# Patient Record
Sex: Male | Born: 1980 | Race: Black or African American | Hispanic: No | Marital: Married | Smoking: Never smoker
Health system: Southern US, Community
[De-identification: ages and names within clinical notes are randomized; demographics above are authoritative.]

## PROBLEM LIST (undated history)

## (undated) DIAGNOSIS — G4700 Insomnia, unspecified: Secondary | ICD-10-CM

## (undated) HISTORY — PX: UPPER GI ENDOSCOPY: SHX6162

## (undated) HISTORY — PX: OTHER SURGICAL HISTORY: SHX169

---

## 2016-10-07 DIAGNOSIS — L723 Sebaceous cyst: Secondary | ICD-10-CM | POA: Diagnosis not present

## 2016-11-04 DIAGNOSIS — Z0000 Encounter for general adult medical examination without abnormal findings: Secondary | ICD-10-CM | POA: Diagnosis not present

## 2016-11-04 DIAGNOSIS — Z13220 Encounter for screening for lipoid disorders: Secondary | ICD-10-CM | POA: Diagnosis not present

## 2018-06-16 DIAGNOSIS — L03011 Cellulitis of right finger: Secondary | ICD-10-CM | POA: Diagnosis not present

## 2018-06-16 DIAGNOSIS — K5909 Other constipation: Secondary | ICD-10-CM | POA: Diagnosis not present

## 2018-06-16 DIAGNOSIS — R030 Elevated blood-pressure reading, without diagnosis of hypertension: Secondary | ICD-10-CM | POA: Diagnosis not present

## 2018-06-21 DIAGNOSIS — L02511 Cutaneous abscess of right hand: Secondary | ICD-10-CM | POA: Diagnosis not present

## 2018-06-21 DIAGNOSIS — L03011 Cellulitis of right finger: Secondary | ICD-10-CM | POA: Diagnosis not present

## 2018-06-23 DIAGNOSIS — L089 Local infection of the skin and subcutaneous tissue, unspecified: Secondary | ICD-10-CM | POA: Diagnosis not present

## 2018-11-15 DIAGNOSIS — Z0000 Encounter for general adult medical examination without abnormal findings: Secondary | ICD-10-CM | POA: Diagnosis not present

## 2019-04-28 DIAGNOSIS — M545 Low back pain: Secondary | ICD-10-CM | POA: Diagnosis not present

## 2019-07-12 DIAGNOSIS — M545 Low back pain: Secondary | ICD-10-CM | POA: Diagnosis not present

## 2019-07-12 DIAGNOSIS — B351 Tinea unguium: Secondary | ICD-10-CM | POA: Diagnosis not present

## 2019-09-14 DIAGNOSIS — F411 Generalized anxiety disorder: Secondary | ICD-10-CM | POA: Diagnosis not present

## 2019-09-21 DIAGNOSIS — F411 Generalized anxiety disorder: Secondary | ICD-10-CM | POA: Diagnosis not present

## 2019-09-22 DIAGNOSIS — Z23 Encounter for immunization: Secondary | ICD-10-CM | POA: Diagnosis not present

## 2019-09-22 DIAGNOSIS — B351 Tinea unguium: Secondary | ICD-10-CM | POA: Diagnosis not present

## 2019-09-22 DIAGNOSIS — K5909 Other constipation: Secondary | ICD-10-CM | POA: Diagnosis not present

## 2019-09-22 DIAGNOSIS — Z79899 Other long term (current) drug therapy: Secondary | ICD-10-CM | POA: Diagnosis not present

## 2019-09-28 DIAGNOSIS — F411 Generalized anxiety disorder: Secondary | ICD-10-CM | POA: Diagnosis not present

## 2019-10-05 DIAGNOSIS — F411 Generalized anxiety disorder: Secondary | ICD-10-CM | POA: Diagnosis not present

## 2019-10-13 DIAGNOSIS — F411 Generalized anxiety disorder: Secondary | ICD-10-CM | POA: Diagnosis not present

## 2019-10-19 DIAGNOSIS — F411 Generalized anxiety disorder: Secondary | ICD-10-CM | POA: Diagnosis not present

## 2019-10-25 DIAGNOSIS — F419 Anxiety disorder, unspecified: Secondary | ICD-10-CM | POA: Diagnosis not present

## 2019-10-25 DIAGNOSIS — R41840 Attention and concentration deficit: Secondary | ICD-10-CM | POA: Diagnosis not present

## 2019-10-25 DIAGNOSIS — F902 Attention-deficit hyperactivity disorder, combined type: Secondary | ICD-10-CM | POA: Diagnosis not present

## 2019-10-26 DIAGNOSIS — F900 Attention-deficit hyperactivity disorder, predominantly inattentive type: Secondary | ICD-10-CM | POA: Diagnosis not present

## 2019-10-26 DIAGNOSIS — F411 Generalized anxiety disorder: Secondary | ICD-10-CM | POA: Diagnosis not present

## 2019-11-01 DIAGNOSIS — B351 Tinea unguium: Secondary | ICD-10-CM | POA: Diagnosis not present

## 2019-11-01 DIAGNOSIS — Z79899 Other long term (current) drug therapy: Secondary | ICD-10-CM | POA: Diagnosis not present

## 2019-11-02 DIAGNOSIS — F330 Major depressive disorder, recurrent, mild: Secondary | ICD-10-CM | POA: Diagnosis not present

## 2019-11-02 DIAGNOSIS — F411 Generalized anxiety disorder: Secondary | ICD-10-CM | POA: Diagnosis not present

## 2019-11-27 DIAGNOSIS — F411 Generalized anxiety disorder: Secondary | ICD-10-CM | POA: Diagnosis not present

## 2019-11-29 DIAGNOSIS — F411 Generalized anxiety disorder: Secondary | ICD-10-CM | POA: Diagnosis not present

## 2019-12-05 DIAGNOSIS — R41840 Attention and concentration deficit: Secondary | ICD-10-CM | POA: Diagnosis not present

## 2019-12-05 DIAGNOSIS — F419 Anxiety disorder, unspecified: Secondary | ICD-10-CM | POA: Diagnosis not present

## 2019-12-05 DIAGNOSIS — F902 Attention-deficit hyperactivity disorder, combined type: Secondary | ICD-10-CM | POA: Diagnosis not present

## 2019-12-05 DIAGNOSIS — Z79899 Other long term (current) drug therapy: Secondary | ICD-10-CM | POA: Diagnosis not present

## 2019-12-13 DIAGNOSIS — F411 Generalized anxiety disorder: Secondary | ICD-10-CM | POA: Diagnosis not present

## 2019-12-21 DIAGNOSIS — F900 Attention-deficit hyperactivity disorder, predominantly inattentive type: Secondary | ICD-10-CM | POA: Diagnosis not present

## 2019-12-21 DIAGNOSIS — F411 Generalized anxiety disorder: Secondary | ICD-10-CM | POA: Diagnosis not present

## 2020-01-03 DIAGNOSIS — F900 Attention-deficit hyperactivity disorder, predominantly inattentive type: Secondary | ICD-10-CM | POA: Diagnosis not present

## 2020-01-17 DIAGNOSIS — F900 Attention-deficit hyperactivity disorder, predominantly inattentive type: Secondary | ICD-10-CM | POA: Diagnosis not present

## 2020-01-25 DIAGNOSIS — F900 Attention-deficit hyperactivity disorder, predominantly inattentive type: Secondary | ICD-10-CM | POA: Diagnosis not present

## 2020-01-25 DIAGNOSIS — Z79899 Other long term (current) drug therapy: Secondary | ICD-10-CM | POA: Diagnosis not present

## 2020-01-25 DIAGNOSIS — B351 Tinea unguium: Secondary | ICD-10-CM | POA: Diagnosis not present

## 2020-01-25 DIAGNOSIS — H6123 Impacted cerumen, bilateral: Secondary | ICD-10-CM | POA: Diagnosis not present

## 2020-01-31 DIAGNOSIS — F900 Attention-deficit hyperactivity disorder, predominantly inattentive type: Secondary | ICD-10-CM | POA: Diagnosis not present

## 2020-02-14 DIAGNOSIS — F900 Attention-deficit hyperactivity disorder, predominantly inattentive type: Secondary | ICD-10-CM | POA: Diagnosis not present

## 2020-03-06 DIAGNOSIS — Z79899 Other long term (current) drug therapy: Secondary | ICD-10-CM | POA: Diagnosis not present

## 2020-03-06 DIAGNOSIS — F902 Attention-deficit hyperactivity disorder, combined type: Secondary | ICD-10-CM | POA: Diagnosis not present

## 2020-03-06 DIAGNOSIS — F419 Anxiety disorder, unspecified: Secondary | ICD-10-CM | POA: Diagnosis not present

## 2020-03-08 DIAGNOSIS — F900 Attention-deficit hyperactivity disorder, predominantly inattentive type: Secondary | ICD-10-CM | POA: Diagnosis not present

## 2020-03-22 DIAGNOSIS — F900 Attention-deficit hyperactivity disorder, predominantly inattentive type: Secondary | ICD-10-CM | POA: Diagnosis not present

## 2020-04-05 DIAGNOSIS — F900 Attention-deficit hyperactivity disorder, predominantly inattentive type: Secondary | ICD-10-CM | POA: Diagnosis not present

## 2020-04-19 DIAGNOSIS — F900 Attention-deficit hyperactivity disorder, predominantly inattentive type: Secondary | ICD-10-CM | POA: Diagnosis not present

## 2020-05-03 DIAGNOSIS — F900 Attention-deficit hyperactivity disorder, predominantly inattentive type: Secondary | ICD-10-CM | POA: Diagnosis not present

## 2020-05-13 DIAGNOSIS — F900 Attention-deficit hyperactivity disorder, predominantly inattentive type: Secondary | ICD-10-CM | POA: Diagnosis not present

## 2020-06-11 DIAGNOSIS — Z79899 Other long term (current) drug therapy: Secondary | ICD-10-CM | POA: Diagnosis not present

## 2020-06-11 DIAGNOSIS — F902 Attention-deficit hyperactivity disorder, combined type: Secondary | ICD-10-CM | POA: Diagnosis not present

## 2020-06-14 DIAGNOSIS — F900 Attention-deficit hyperactivity disorder, predominantly inattentive type: Secondary | ICD-10-CM | POA: Diagnosis not present

## 2020-06-26 DIAGNOSIS — F900 Attention-deficit hyperactivity disorder, predominantly inattentive type: Secondary | ICD-10-CM | POA: Diagnosis not present

## 2020-07-03 DIAGNOSIS — F900 Attention-deficit hyperactivity disorder, predominantly inattentive type: Secondary | ICD-10-CM | POA: Diagnosis not present

## 2020-07-12 DIAGNOSIS — F900 Attention-deficit hyperactivity disorder, predominantly inattentive type: Secondary | ICD-10-CM | POA: Diagnosis not present

## 2020-07-18 DIAGNOSIS — Z0000 Encounter for general adult medical examination without abnormal findings: Secondary | ICD-10-CM | POA: Diagnosis not present

## 2020-07-18 DIAGNOSIS — Z13220 Encounter for screening for lipoid disorders: Secondary | ICD-10-CM | POA: Diagnosis not present

## 2020-07-18 DIAGNOSIS — K5900 Constipation, unspecified: Secondary | ICD-10-CM | POA: Diagnosis not present

## 2020-07-18 DIAGNOSIS — L723 Sebaceous cyst: Secondary | ICD-10-CM | POA: Diagnosis not present

## 2020-07-26 DIAGNOSIS — F900 Attention-deficit hyperactivity disorder, predominantly inattentive type: Secondary | ICD-10-CM | POA: Diagnosis not present

## 2020-07-31 DIAGNOSIS — F900 Attention-deficit hyperactivity disorder, predominantly inattentive type: Secondary | ICD-10-CM | POA: Diagnosis not present

## 2020-08-07 DIAGNOSIS — F900 Attention-deficit hyperactivity disorder, predominantly inattentive type: Secondary | ICD-10-CM | POA: Diagnosis not present

## 2020-08-29 DIAGNOSIS — F900 Attention-deficit hyperactivity disorder, predominantly inattentive type: Secondary | ICD-10-CM | POA: Diagnosis not present

## 2020-09-10 DIAGNOSIS — Z79899 Other long term (current) drug therapy: Secondary | ICD-10-CM | POA: Diagnosis not present

## 2020-09-10 DIAGNOSIS — F902 Attention-deficit hyperactivity disorder, combined type: Secondary | ICD-10-CM | POA: Diagnosis not present

## 2020-09-10 DIAGNOSIS — F988 Other specified behavioral and emotional disorders with onset usually occurring in childhood and adolescence: Secondary | ICD-10-CM | POA: Diagnosis not present

## 2020-09-12 DIAGNOSIS — M25531 Pain in right wrist: Secondary | ICD-10-CM | POA: Diagnosis not present

## 2020-09-12 DIAGNOSIS — S76211A Strain of adductor muscle, fascia and tendon of right thigh, initial encounter: Secondary | ICD-10-CM | POA: Diagnosis not present

## 2020-09-13 DIAGNOSIS — F4329 Adjustment disorder with other symptoms: Secondary | ICD-10-CM | POA: Diagnosis not present

## 2020-09-13 DIAGNOSIS — F900 Attention-deficit hyperactivity disorder, predominantly inattentive type: Secondary | ICD-10-CM | POA: Diagnosis not present

## 2020-09-19 DIAGNOSIS — F900 Attention-deficit hyperactivity disorder, predominantly inattentive type: Secondary | ICD-10-CM | POA: Diagnosis not present

## 2020-09-30 DIAGNOSIS — F4329 Adjustment disorder with other symptoms: Secondary | ICD-10-CM | POA: Diagnosis not present

## 2020-09-30 DIAGNOSIS — F900 Attention-deficit hyperactivity disorder, predominantly inattentive type: Secondary | ICD-10-CM | POA: Diagnosis not present

## 2020-10-09 ENCOUNTER — Institutional Professional Consult (permissible substitution): Payer: Self-pay | Admitting: Plastic Surgery

## 2020-10-10 DIAGNOSIS — F900 Attention-deficit hyperactivity disorder, predominantly inattentive type: Secondary | ICD-10-CM | POA: Diagnosis not present

## 2020-10-24 DIAGNOSIS — F4329 Adjustment disorder with other symptoms: Secondary | ICD-10-CM | POA: Diagnosis not present

## 2020-10-24 DIAGNOSIS — F900 Attention-deficit hyperactivity disorder, predominantly inattentive type: Secondary | ICD-10-CM | POA: Diagnosis not present

## 2020-11-07 DIAGNOSIS — F4329 Adjustment disorder with other symptoms: Secondary | ICD-10-CM | POA: Diagnosis not present

## 2020-11-07 DIAGNOSIS — F900 Attention-deficit hyperactivity disorder, predominantly inattentive type: Secondary | ICD-10-CM | POA: Diagnosis not present

## 2020-11-14 DIAGNOSIS — F4329 Adjustment disorder with other symptoms: Secondary | ICD-10-CM | POA: Diagnosis not present

## 2020-11-14 DIAGNOSIS — F900 Attention-deficit hyperactivity disorder, predominantly inattentive type: Secondary | ICD-10-CM | POA: Diagnosis not present

## 2020-11-19 DIAGNOSIS — F419 Anxiety disorder, unspecified: Secondary | ICD-10-CM | POA: Diagnosis not present

## 2020-11-21 DIAGNOSIS — F900 Attention-deficit hyperactivity disorder, predominantly inattentive type: Secondary | ICD-10-CM | POA: Diagnosis not present

## 2020-11-21 DIAGNOSIS — F4329 Adjustment disorder with other symptoms: Secondary | ICD-10-CM | POA: Diagnosis not present

## 2020-11-28 DIAGNOSIS — F4329 Adjustment disorder with other symptoms: Secondary | ICD-10-CM | POA: Diagnosis not present

## 2020-11-28 DIAGNOSIS — F900 Attention-deficit hyperactivity disorder, predominantly inattentive type: Secondary | ICD-10-CM | POA: Diagnosis not present

## 2020-12-12 DIAGNOSIS — F4329 Adjustment disorder with other symptoms: Secondary | ICD-10-CM | POA: Diagnosis not present

## 2020-12-12 DIAGNOSIS — F900 Attention-deficit hyperactivity disorder, predominantly inattentive type: Secondary | ICD-10-CM | POA: Diagnosis not present

## 2020-12-19 DIAGNOSIS — F4329 Adjustment disorder with other symptoms: Secondary | ICD-10-CM | POA: Diagnosis not present

## 2020-12-19 DIAGNOSIS — F900 Attention-deficit hyperactivity disorder, predominantly inattentive type: Secondary | ICD-10-CM | POA: Diagnosis not present

## 2020-12-24 DIAGNOSIS — F419 Anxiety disorder, unspecified: Secondary | ICD-10-CM | POA: Diagnosis not present

## 2020-12-26 DIAGNOSIS — F4329 Adjustment disorder with other symptoms: Secondary | ICD-10-CM | POA: Diagnosis not present

## 2020-12-26 DIAGNOSIS — F900 Attention-deficit hyperactivity disorder, predominantly inattentive type: Secondary | ICD-10-CM | POA: Diagnosis not present

## 2020-12-31 DIAGNOSIS — F419 Anxiety disorder, unspecified: Secondary | ICD-10-CM | POA: Diagnosis not present

## 2021-01-14 DIAGNOSIS — F4329 Adjustment disorder with other symptoms: Secondary | ICD-10-CM | POA: Diagnosis not present

## 2021-01-14 DIAGNOSIS — F900 Attention-deficit hyperactivity disorder, predominantly inattentive type: Secondary | ICD-10-CM | POA: Diagnosis not present

## 2021-01-21 DIAGNOSIS — F419 Anxiety disorder, unspecified: Secondary | ICD-10-CM | POA: Diagnosis not present

## 2021-01-24 DIAGNOSIS — F900 Attention-deficit hyperactivity disorder, predominantly inattentive type: Secondary | ICD-10-CM | POA: Diagnosis not present

## 2021-01-24 DIAGNOSIS — F4329 Adjustment disorder with other symptoms: Secondary | ICD-10-CM | POA: Diagnosis not present

## 2021-01-30 DIAGNOSIS — F4329 Adjustment disorder with other symptoms: Secondary | ICD-10-CM | POA: Diagnosis not present

## 2021-01-30 DIAGNOSIS — F900 Attention-deficit hyperactivity disorder, predominantly inattentive type: Secondary | ICD-10-CM | POA: Diagnosis not present

## 2021-02-04 DIAGNOSIS — F419 Anxiety disorder, unspecified: Secondary | ICD-10-CM | POA: Diagnosis not present

## 2021-02-12 ENCOUNTER — Other Ambulatory Visit: Payer: Self-pay

## 2021-02-12 ENCOUNTER — Encounter: Payer: Self-pay | Admitting: Plastic Surgery

## 2021-02-12 ENCOUNTER — Ambulatory Visit (INDEPENDENT_AMBULATORY_CARE_PROVIDER_SITE_OTHER): Payer: BC Managed Care – PPO | Admitting: Plastic Surgery

## 2021-02-12 VITALS — BP 135/82 | HR 81 | Ht 72.0 in | Wt 205.0 lb

## 2021-02-12 DIAGNOSIS — L723 Sebaceous cyst: Secondary | ICD-10-CM | POA: Diagnosis not present

## 2021-02-12 NOTE — Progress Notes (Signed)
° °  Referring Provider Aliene Beams, MD [id number] [place],  [place] [address]   CC:  Chief Complaint  Patient presents with   Consult      Jesse Jefferson is an 41 y.o. male.  HPI: Patient presents with concerns for several cystic lesions and a skin tag in his right upper cheek.  He had a mass excised in his central back a number of years ago.  He believes at some point over the past year or 2 its come back and is now palpable and mobile again.  He has another cystic lesion on his scalp and another on the back of his neck that are bothering him and all growing in size.  No Known Allergies  Outpatient Encounter Medications as of 02/12/2021  Medication Sig   VYVANSE 60 MG CHEW Chew by mouth. Take 30mg  by mouth bid.   No facility-administered encounter medications on file as of 02/12/2021.     No past medical history on file.  No family history on file.  Social History   Social History Narrative   Not on file     Review of Systems General: Denies fevers, chills, weight loss CV: Denies chest pain, shortness of breath, palpitations  Physical Exam Vitals with BMI 02/12/2021  Height 6\' 0"   Weight 205 lbs  BMI 27.8  Systolic 135  Diastolic 82  Pulse 81    General:  No acute distress,  Alert and oriented, Non-Toxic, Normal speech and affect Examination shows a small skin tag in the right upper cheek.  On his scalp there is a 3 cm subcutaneous mobile mass.  On the back of his neck there is a 1 cm subcutaneous mobile mass.  In his central back there is a 3 cm subcutaneous mobile mass.  In his back there is a longitudinal scar overlying it that smaller than the mass.  Assessment/Plan Patient presents with several lesions that I suspect to be cysts.  We discussed excision under local.  I could also excise the skin tag at the same time.  We discussed risks include bleeding, infection, damage to surrounding structures need for additional procedures.  All of his questions were  answered and he is interested in moving forward  04/12/2021 02/12/2021, 2:44 PM

## 2021-02-19 DIAGNOSIS — F4329 Adjustment disorder with other symptoms: Secondary | ICD-10-CM | POA: Diagnosis not present

## 2021-02-19 DIAGNOSIS — F900 Attention-deficit hyperactivity disorder, predominantly inattentive type: Secondary | ICD-10-CM | POA: Diagnosis not present

## 2021-02-20 ENCOUNTER — Ambulatory Visit: Payer: BC Managed Care – PPO | Admitting: Plastic Surgery

## 2021-03-04 DIAGNOSIS — F419 Anxiety disorder, unspecified: Secondary | ICD-10-CM | POA: Diagnosis not present

## 2021-03-06 ENCOUNTER — Ambulatory Visit: Payer: BC Managed Care – PPO | Admitting: Plastic Surgery

## 2021-03-10 DIAGNOSIS — F902 Attention-deficit hyperactivity disorder, combined type: Secondary | ICD-10-CM | POA: Diagnosis not present

## 2021-03-10 DIAGNOSIS — F4329 Adjustment disorder with other symptoms: Secondary | ICD-10-CM | POA: Diagnosis not present

## 2021-03-10 DIAGNOSIS — F900 Attention-deficit hyperactivity disorder, predominantly inattentive type: Secondary | ICD-10-CM | POA: Diagnosis not present

## 2021-03-10 DIAGNOSIS — Z79899 Other long term (current) drug therapy: Secondary | ICD-10-CM | POA: Diagnosis not present

## 2021-03-11 DIAGNOSIS — F419 Anxiety disorder, unspecified: Secondary | ICD-10-CM | POA: Diagnosis not present

## 2021-03-25 DIAGNOSIS — F419 Anxiety disorder, unspecified: Secondary | ICD-10-CM | POA: Diagnosis not present

## 2021-03-31 DIAGNOSIS — Z79899 Other long term (current) drug therapy: Secondary | ICD-10-CM | POA: Diagnosis not present

## 2021-03-31 DIAGNOSIS — B351 Tinea unguium: Secondary | ICD-10-CM | POA: Diagnosis not present

## 2021-04-02 DIAGNOSIS — F900 Attention-deficit hyperactivity disorder, predominantly inattentive type: Secondary | ICD-10-CM | POA: Diagnosis not present

## 2021-04-02 DIAGNOSIS — F4329 Adjustment disorder with other symptoms: Secondary | ICD-10-CM | POA: Diagnosis not present

## 2021-04-08 DIAGNOSIS — F419 Anxiety disorder, unspecified: Secondary | ICD-10-CM | POA: Diagnosis not present

## 2021-06-02 DIAGNOSIS — B351 Tinea unguium: Secondary | ICD-10-CM | POA: Diagnosis not present

## 2021-06-02 DIAGNOSIS — Z79899 Other long term (current) drug therapy: Secondary | ICD-10-CM | POA: Diagnosis not present

## 2021-06-19 DIAGNOSIS — F419 Anxiety disorder, unspecified: Secondary | ICD-10-CM | POA: Diagnosis not present

## 2021-06-20 DIAGNOSIS — F4329 Adjustment disorder with other symptoms: Secondary | ICD-10-CM | POA: Diagnosis not present

## 2021-06-20 DIAGNOSIS — F900 Attention-deficit hyperactivity disorder, predominantly inattentive type: Secondary | ICD-10-CM | POA: Diagnosis not present

## 2021-06-26 DIAGNOSIS — M545 Low back pain, unspecified: Secondary | ICD-10-CM | POA: Diagnosis not present

## 2021-06-30 DIAGNOSIS — M545 Low back pain, unspecified: Secondary | ICD-10-CM | POA: Diagnosis not present

## 2021-07-01 DIAGNOSIS — F4329 Adjustment disorder with other symptoms: Secondary | ICD-10-CM | POA: Diagnosis not present

## 2021-07-01 DIAGNOSIS — F900 Attention-deficit hyperactivity disorder, predominantly inattentive type: Secondary | ICD-10-CM | POA: Diagnosis not present

## 2021-07-03 ENCOUNTER — Other Ambulatory Visit: Payer: Self-pay | Admitting: Sports Medicine

## 2021-07-03 ENCOUNTER — Ambulatory Visit
Admission: RE | Admit: 2021-07-03 | Discharge: 2021-07-03 | Disposition: A | Discharge status: Home / Self Care | Payer: BC Managed Care – PPO | Source: Ambulatory Visit | Attending: Sports Medicine | Admitting: Sports Medicine

## 2021-07-03 DIAGNOSIS — M545 Low back pain, unspecified: Secondary | ICD-10-CM | POA: Diagnosis not present

## 2021-07-16 DIAGNOSIS — Z03818 Encounter for observation for suspected exposure to other biological agents ruled out: Secondary | ICD-10-CM | POA: Diagnosis not present

## 2021-07-16 DIAGNOSIS — R051 Acute cough: Secondary | ICD-10-CM | POA: Diagnosis not present

## 2021-07-16 DIAGNOSIS — J029 Acute pharyngitis, unspecified: Secondary | ICD-10-CM | POA: Diagnosis not present

## 2021-07-16 DIAGNOSIS — M791 Myalgia, unspecified site: Secondary | ICD-10-CM | POA: Diagnosis not present

## 2021-07-17 DIAGNOSIS — F4329 Adjustment disorder with other symptoms: Secondary | ICD-10-CM | POA: Diagnosis not present

## 2021-07-17 DIAGNOSIS — F900 Attention-deficit hyperactivity disorder, predominantly inattentive type: Secondary | ICD-10-CM | POA: Diagnosis not present

## 2021-07-21 DIAGNOSIS — F419 Anxiety disorder, unspecified: Secondary | ICD-10-CM | POA: Diagnosis not present

## 2021-07-31 DIAGNOSIS — F419 Anxiety disorder, unspecified: Secondary | ICD-10-CM | POA: Diagnosis not present

## 2021-07-31 DIAGNOSIS — F900 Attention-deficit hyperactivity disorder, predominantly inattentive type: Secondary | ICD-10-CM | POA: Diagnosis not present

## 2021-07-31 DIAGNOSIS — F4329 Adjustment disorder with other symptoms: Secondary | ICD-10-CM | POA: Diagnosis not present

## 2021-08-06 DIAGNOSIS — Z0000 Encounter for general adult medical examination without abnormal findings: Secondary | ICD-10-CM | POA: Diagnosis not present

## 2021-08-11 DIAGNOSIS — M5451 Vertebrogenic low back pain: Secondary | ICD-10-CM | POA: Diagnosis not present

## 2021-08-11 DIAGNOSIS — M6281 Muscle weakness (generalized): Secondary | ICD-10-CM | POA: Diagnosis not present

## 2021-08-15 DIAGNOSIS — F900 Attention-deficit hyperactivity disorder, predominantly inattentive type: Secondary | ICD-10-CM | POA: Diagnosis not present

## 2021-08-15 DIAGNOSIS — F4329 Adjustment disorder with other symptoms: Secondary | ICD-10-CM | POA: Diagnosis not present

## 2021-08-20 DIAGNOSIS — Z3009 Encounter for other general counseling and advice on contraception: Secondary | ICD-10-CM | POA: Diagnosis not present

## 2021-08-25 DIAGNOSIS — M6281 Muscle weakness (generalized): Secondary | ICD-10-CM | POA: Diagnosis not present

## 2021-08-25 DIAGNOSIS — M5451 Vertebrogenic low back pain: Secondary | ICD-10-CM | POA: Diagnosis not present

## 2021-08-28 DIAGNOSIS — F419 Anxiety disorder, unspecified: Secondary | ICD-10-CM | POA: Diagnosis not present

## 2021-09-04 DIAGNOSIS — Z79899 Other long term (current) drug therapy: Secondary | ICD-10-CM | POA: Diagnosis not present

## 2021-09-04 DIAGNOSIS — F902 Attention-deficit hyperactivity disorder, combined type: Secondary | ICD-10-CM | POA: Diagnosis not present

## 2021-09-18 DIAGNOSIS — F900 Attention-deficit hyperactivity disorder, predominantly inattentive type: Secondary | ICD-10-CM | POA: Diagnosis not present

## 2021-09-18 DIAGNOSIS — F4329 Adjustment disorder with other symptoms: Secondary | ICD-10-CM | POA: Diagnosis not present

## 2021-10-02 DIAGNOSIS — F900 Attention-deficit hyperactivity disorder, predominantly inattentive type: Secondary | ICD-10-CM | POA: Diagnosis not present

## 2021-10-02 DIAGNOSIS — F4329 Adjustment disorder with other symptoms: Secondary | ICD-10-CM | POA: Diagnosis not present

## 2021-10-16 DIAGNOSIS — F4329 Adjustment disorder with other symptoms: Secondary | ICD-10-CM | POA: Diagnosis not present

## 2021-10-16 DIAGNOSIS — F900 Attention-deficit hyperactivity disorder, predominantly inattentive type: Secondary | ICD-10-CM | POA: Diagnosis not present

## 2021-11-06 DIAGNOSIS — F900 Attention-deficit hyperactivity disorder, predominantly inattentive type: Secondary | ICD-10-CM | POA: Diagnosis not present

## 2021-11-06 DIAGNOSIS — F4329 Adjustment disorder with other symptoms: Secondary | ICD-10-CM | POA: Diagnosis not present

## 2021-11-13 DIAGNOSIS — F4329 Adjustment disorder with other symptoms: Secondary | ICD-10-CM | POA: Diagnosis not present

## 2021-11-13 DIAGNOSIS — F900 Attention-deficit hyperactivity disorder, predominantly inattentive type: Secondary | ICD-10-CM | POA: Diagnosis not present

## 2021-11-27 DIAGNOSIS — F4329 Adjustment disorder with other symptoms: Secondary | ICD-10-CM | POA: Diagnosis not present

## 2021-11-27 DIAGNOSIS — F900 Attention-deficit hyperactivity disorder, predominantly inattentive type: Secondary | ICD-10-CM | POA: Diagnosis not present

## 2021-12-11 DIAGNOSIS — F4329 Adjustment disorder with other symptoms: Secondary | ICD-10-CM | POA: Diagnosis not present

## 2021-12-11 DIAGNOSIS — F900 Attention-deficit hyperactivity disorder, predominantly inattentive type: Secondary | ICD-10-CM | POA: Diagnosis not present

## 2021-12-17 DIAGNOSIS — Z302 Encounter for sterilization: Secondary | ICD-10-CM | POA: Diagnosis not present

## 2021-12-22 DIAGNOSIS — F419 Anxiety disorder, unspecified: Secondary | ICD-10-CM | POA: Diagnosis not present

## 2021-12-25 DIAGNOSIS — F900 Attention-deficit hyperactivity disorder, predominantly inattentive type: Secondary | ICD-10-CM | POA: Diagnosis not present

## 2021-12-25 DIAGNOSIS — F4329 Adjustment disorder with other symptoms: Secondary | ICD-10-CM | POA: Diagnosis not present

## 2022-01-08 DIAGNOSIS — F4329 Adjustment disorder with other symptoms: Secondary | ICD-10-CM | POA: Diagnosis not present

## 2022-01-08 DIAGNOSIS — F900 Attention-deficit hyperactivity disorder, predominantly inattentive type: Secondary | ICD-10-CM | POA: Diagnosis not present

## 2022-01-12 HISTORY — PX: VASECTOMY: SHX75

## 2022-01-22 DIAGNOSIS — F419 Anxiety disorder, unspecified: Secondary | ICD-10-CM | POA: Diagnosis not present

## 2022-10-03 IMAGING — CR DG LUMBAR SPINE 2-3V
3 series · 3 of 3 positions shown · non-contrast
Comparison: None Available.

CLINICAL DATA: Lumbar spine pain.

EXAM:
LUMBAR SPINE - 2-3 VIEW

[w lumbar spine ap]
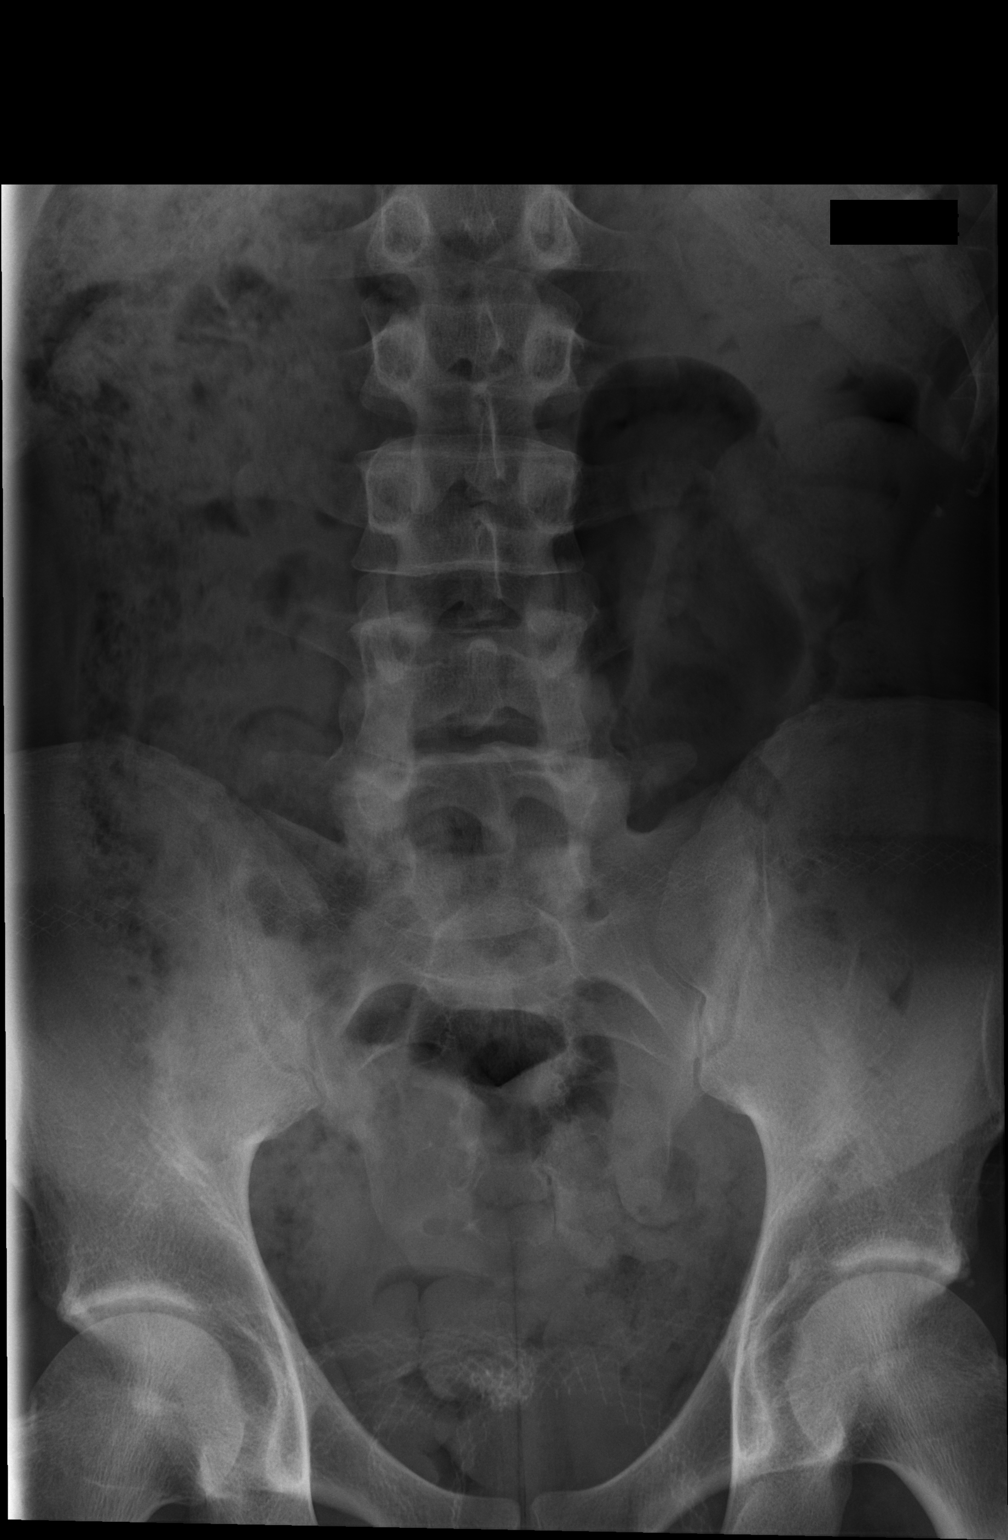

[w lumbar spine lat]
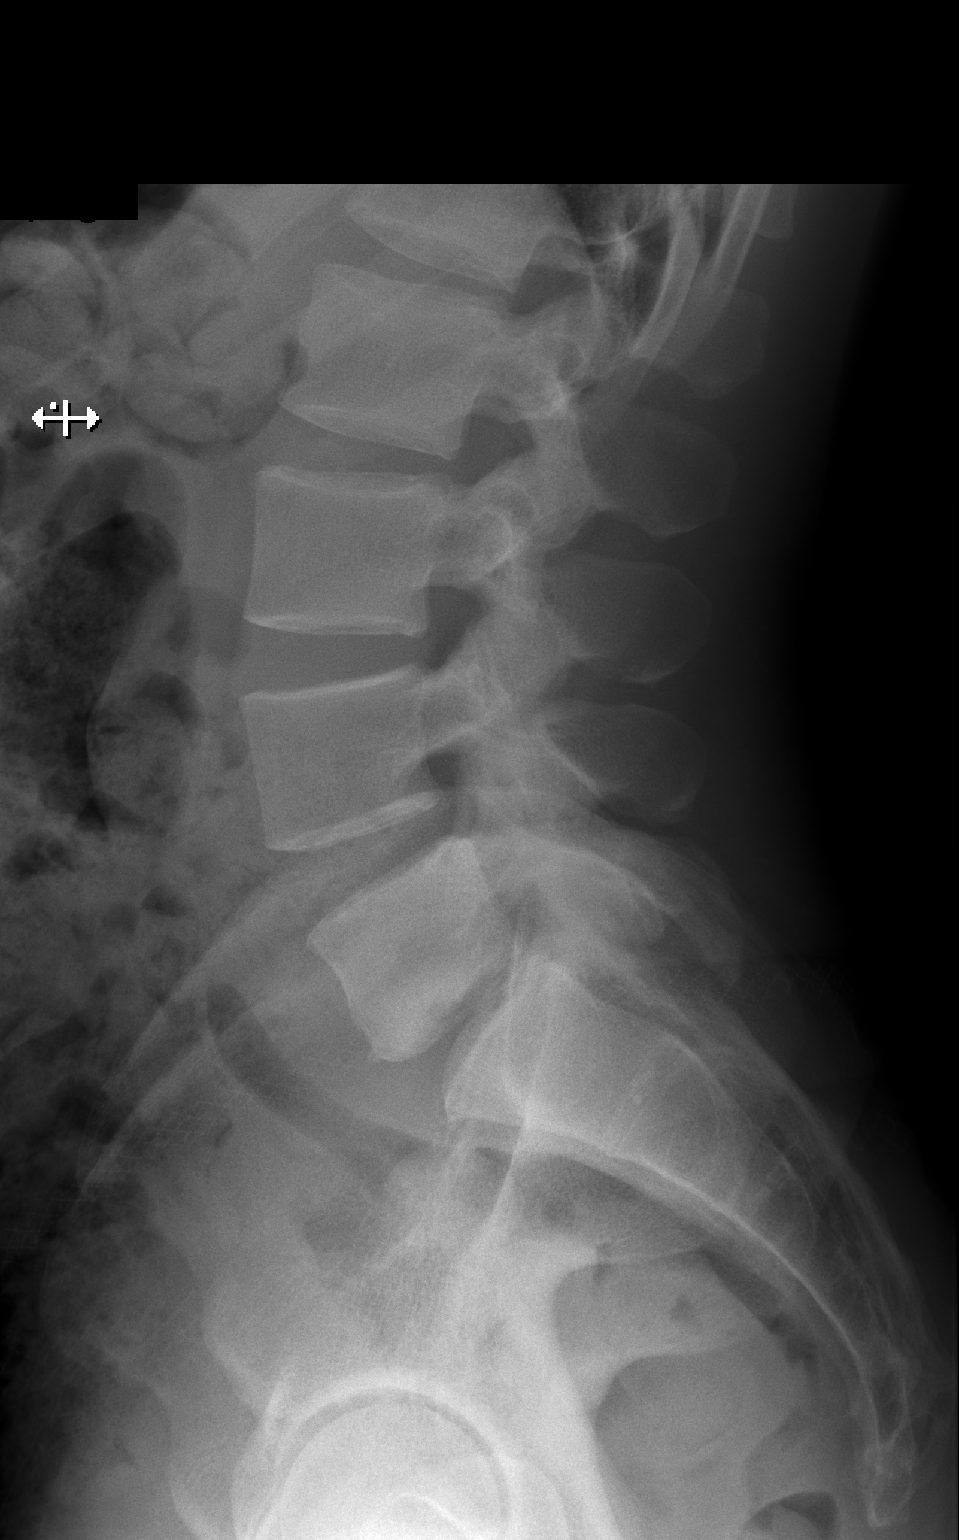

[w lumbar l-5 s-1 spot]
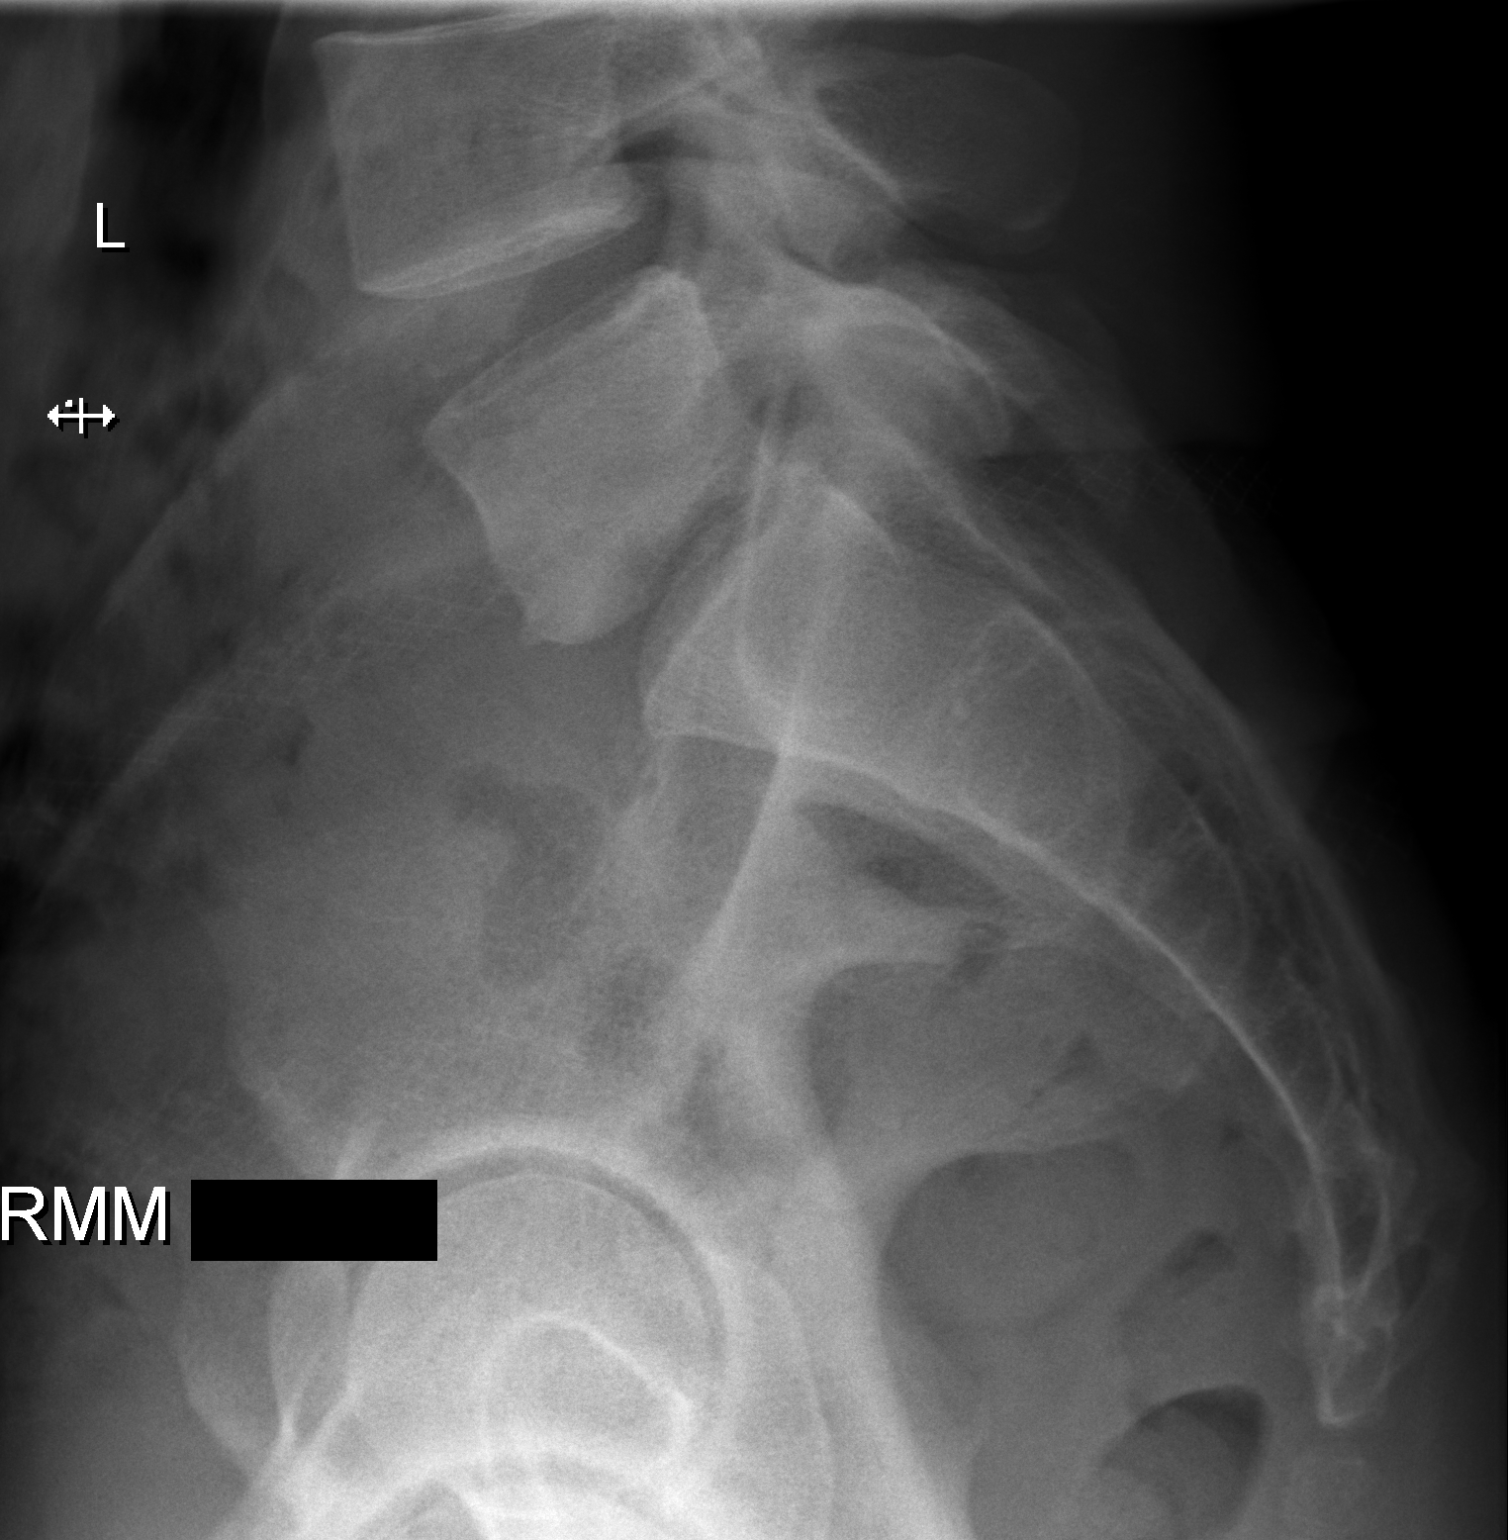

[3 of 3 positions shown; findings below may reference images not displayed]

FINDINGS: There is no evidence of lumbar spine fracture. Alignment is normal.
Mild- degenerative joint changes at L5-S1 with narrowed joint space
osteophyte formation facet joint sclerosis are.
IMPRESSION: No acute fracture or dislocation. Mild degenerative joint changes at
L5-S1.

## 2023-01-21 ENCOUNTER — Other Ambulatory Visit (HOSPITAL_COMMUNITY): Payer: Self-pay

## 2023-01-21 MED ORDER — LISDEXAMFETAMINE DIMESYLATE 60 MG PO CHEW
60.0000 mg | CHEWABLE_TABLET | Freq: Every morning | ORAL | 0 refills | Status: DC
  Filled 2023-01-21: qty 30, 30d supply, fill #0

## 2023-02-22 ENCOUNTER — Other Ambulatory Visit (HOSPITAL_COMMUNITY): Payer: Self-pay

## 2023-02-22 MED ORDER — LISDEXAMFETAMINE DIMESYLATE 60 MG PO CHEW
60.0000 mg | CHEWABLE_TABLET | Freq: Every morning | ORAL | 0 refills | Status: DC
  Filled 2023-02-22: qty 30, 30d supply, fill #0

## 2023-03-16 ENCOUNTER — Other Ambulatory Visit (HOSPITAL_COMMUNITY): Payer: Self-pay

## 2023-03-16 MED ORDER — AMOXICILLIN-POT CLAVULANATE 875-125 MG PO TABS
1.0000 | ORAL_TABLET | Freq: Two times a day (BID) | ORAL | 0 refills | Status: DC
Start: 2023-03-16 — End: 2023-03-25
  Filled 2023-03-16: qty 10, 5d supply, fill #0

## 2023-03-17 ENCOUNTER — Other Ambulatory Visit (HOSPITAL_COMMUNITY): Payer: Self-pay

## 2023-03-17 MED ORDER — KETOROLAC TROMETHAMINE 10 MG PO TABS
10.0000 mg | ORAL_TABLET | Freq: Three times a day (TID) | ORAL | 0 refills | Status: DC | PRN
  Filled 2023-03-17: qty 10, 3d supply, fill #0

## 2023-03-22 ENCOUNTER — Other Ambulatory Visit (HOSPITAL_COMMUNITY): Payer: Self-pay

## 2023-03-22 MED ORDER — LISDEXAMFETAMINE DIMESYLATE 60 MG PO CHEW
1.0000 | CHEWABLE_TABLET | Freq: Every morning | ORAL | 0 refills | Status: DC
  Filled 2023-03-22: qty 30, 30d supply, fill #0

## 2023-03-25 ENCOUNTER — Other Ambulatory Visit (HOSPITAL_COMMUNITY): Payer: Self-pay

## 2023-03-25 MED ORDER — AMOXICILLIN-POT CLAVULANATE 875-125 MG PO TABS
1.0000 | ORAL_TABLET | Freq: Two times a day (BID) | ORAL | 0 refills | Status: DC
  Filled 2023-03-25: qty 20, 10d supply, fill #0

## 2023-04-15 ENCOUNTER — Other Ambulatory Visit (HOSPITAL_COMMUNITY): Payer: Self-pay

## 2023-04-15 MED ORDER — LISDEXAMFETAMINE DIMESYLATE 60 MG PO CHEW
1.0000 | CHEWABLE_TABLET | Freq: Every morning | ORAL | 0 refills | Status: DC
  Filled 2023-04-15 – 2023-04-26 (×2): qty 30, 30d supply, fill #0

## 2023-04-26 ENCOUNTER — Other Ambulatory Visit (HOSPITAL_COMMUNITY): Payer: Self-pay

## 2023-05-24 ENCOUNTER — Other Ambulatory Visit (HOSPITAL_COMMUNITY): Payer: Self-pay

## 2023-05-24 MED ORDER — LISDEXAMFETAMINE DIMESYLATE 60 MG PO CHEW
1.0000 | CHEWABLE_TABLET | Freq: Every morning | ORAL | 0 refills | Status: DC
Start: 2023-05-24 — End: 2023-06-22
  Filled 2023-05-24: qty 30, 30d supply, fill #0

## 2023-06-22 ENCOUNTER — Other Ambulatory Visit (HOSPITAL_COMMUNITY): Payer: Self-pay

## 2023-06-22 MED ORDER — LISDEXAMFETAMINE DIMESYLATE 60 MG PO CHEW
1.0000 | CHEWABLE_TABLET | Freq: Every morning | ORAL | 0 refills | Status: DC
  Filled 2023-06-22: qty 30, 30d supply, fill #0

## 2023-07-22 ENCOUNTER — Other Ambulatory Visit (HOSPITAL_COMMUNITY): Payer: Self-pay

## 2023-07-22 MED ORDER — LISDEXAMFETAMINE DIMESYLATE 60 MG PO CHEW
1.0000 | CHEWABLE_TABLET | Freq: Every morning | ORAL | 0 refills | Status: DC
  Filled 2023-07-22: qty 30, 30d supply, fill #0

## 2023-07-26 ENCOUNTER — Other Ambulatory Visit (HOSPITAL_COMMUNITY): Payer: Self-pay

## 2023-08-25 ENCOUNTER — Other Ambulatory Visit (HOSPITAL_COMMUNITY): Payer: Self-pay

## 2023-08-25 MED ORDER — LISDEXAMFETAMINE DIMESYLATE 60 MG PO CHEW
1.0000 | CHEWABLE_TABLET | Freq: Every morning | ORAL | 0 refills | Status: DC
  Filled 2023-08-25: qty 19, 19d supply, fill #0
  Filled 2023-08-25 (×2): qty 11, 11d supply, fill #0
  Filled 2023-08-25: qty 19, 19d supply, fill #0

## 2023-08-30 ENCOUNTER — Ambulatory Visit (INDEPENDENT_AMBULATORY_CARE_PROVIDER_SITE_OTHER): Admitting: Surgical

## 2023-08-30 ENCOUNTER — Encounter: Payer: Self-pay | Admitting: Surgical

## 2023-08-30 VITALS — BP 146/84 | HR 74 | Ht 72.0 in | Wt 216.0 lb

## 2023-08-30 DIAGNOSIS — R220 Localized swelling, mass and lump, head: Secondary | ICD-10-CM

## 2023-08-30 DIAGNOSIS — L918 Other hypertrophic disorders of the skin: Secondary | ICD-10-CM | POA: Diagnosis not present

## 2023-08-30 DIAGNOSIS — D489 Neoplasm of uncertain behavior, unspecified: Secondary | ICD-10-CM

## 2023-08-30 DIAGNOSIS — R222 Localized swelling, mass and lump, trunk: Secondary | ICD-10-CM

## 2023-08-30 NOTE — Progress Notes (Addendum)
 Referring Provider Jesse Millman, MD [phone] Jesse Jefferson [address] [place],  [place] [address]   CC:  Chief Complaint  Patient presents with   Follow-up      Jesse Jefferson is an 43 y.o. male.  HPI: Patient is a 43 year old male here for evaluation of scalp mass, back mass and right lower eyelid skin tag.  Patient was previously seen in February 2023 for consultation and discussed excision. Patient was lost to follow-up, but is here for reevaluation and to discuss surgical excision.  He has had the cyst on his back excised in the past, has a 2 cm vertical scar just to the left of midline at the mid back area.  Patient reports scalp mass and back mass have continued to increase in size over the past 2 to 3 years.  He is interested in surgical excision.  He has tolerated anesthesia in the past without issue.  He has had an endoscopy and tolerated that well.    No Known Allergies  Outpatient Encounter Medications as of 08/30/2023  Medication Sig   amoxicillin -clavulanate (AUGMENTIN ) 875-125 MG tablet Take 1 tablet by mouth every 12 (twelve) hours.   ketorolac  (TORADOL ) 10 MG tablet TAKE 1 TABLET EVERY 8 HOURS AS NEEDED FOR PAIN.   Lisdexamfetamine Dimesylate  (VYVANSE ) 60 MG CHEW Take 1 tablet by mouth daily in the morning   VYVANSE  60 MG CHEW Chew by mouth. Take 30mg  by mouth bid.   No facility-administered encounter medications on file as of 08/30/2023.     No past medical history on file.  No family history on file.  Social History   Social History Narrative   Not on file     Review of Systems General: Denies fevers, chills, weight loss CV: Denies chest pain, shortness of breath, palpitations  Physical Exam    08/30/2023   10:39 AM 02/12/2021    1:46 PM  Vitals with BMI  Height 6' 0 6' 0  Weight 216 lbs 205 lbs  BMI 29.29 27.8  Systolic 146 135  Diastolic 84 82  Pulse 74 81    General:  No acute distress,  Alert and oriented, Non-Toxic, Normal speech and  affect Skin: Back mass with previous scar that is 2 cm, cyst is approximately 2.5 x 2.5 cm.  No overlying skin changes.  Head mass is 2 x 2 cm at the apex of the scalp. Right lower eyelid skin tag, it is very small, less than 1 mm.  Suspect mass of back and scalp are cysts.  Assessment/Plan 43 year old male here for evaluation of back mass that was previously excised but has recurred and a scalp mass.  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   Dr. Montorfano was present for part of today's encounter, discussed surgical excision with patient in operating room due to scalp mass.  Discussed excision of back mass, scalp mass and right lower eyelid skin tag.  Discussed risks including bleeding, infection, recurrent seromas or hematomas, need for additional procedures or revisions, recurrence of mass/cyst.  Patient is a good candidate for excision of back mass and scalp mass and right lower eyelid skin tag. Will plan for excision in OR with Dr. Montorfano.   Jesse Jefferson 08/30/2023, 10:48 AM   I personally assessed and examined the patient, and I agree with the PA assessment and plan. We discussed the risks, benefits and alternatives to cysts and skin tag excisions.  We discussed the alternatives which include continued observation; however, I told the patient that I do not believe these lesions will resolve on their own. We discussed the risks of excision which include infection, bleeding, damage to surrounding healthy tissue, seroma, hematoma and need for further surgery. We also discussed the risks of skin and fat atrophy, wound separation, abnormal scarring, recurrence of cysts and skin tags. We discussed the risks of anesthesia which will be further elaborated on by our anesthesia colleagues. The patient voiced understanding and wishes to proceed. All questions and concerns were addressed to the patient's apparent  satisfaction.  Lisandro M. Montorfano, MD [facility]

## 2023-09-24 ENCOUNTER — Other Ambulatory Visit (HOSPITAL_COMMUNITY): Payer: Self-pay

## 2023-09-24 MED ORDER — LISDEXAMFETAMINE DIMESYLATE 60 MG PO CHEW
1.0000 | CHEWABLE_TABLET | Freq: Every morning | ORAL | 0 refills | Status: DC
  Filled 2023-09-24: qty 30, 30d supply, fill #0

## 2023-09-27 ENCOUNTER — Telehealth: Payer: Self-pay | Admitting: Surgical

## 2023-09-27 NOTE — Telephone Encounter (Signed)
 Patient called to Schedule surgery

## 2023-09-30 ENCOUNTER — Other Ambulatory Visit (HOSPITAL_COMMUNITY): Payer: Self-pay

## 2023-09-30 MED ORDER — TRAZODONE HCL 50 MG PO TABS
50.0000 mg | ORAL_TABLET | Freq: Every evening | ORAL | 0 refills | Status: AC | PRN
  Filled 2023-09-30: qty 60, 30d supply, fill #0

## 2023-10-11 ENCOUNTER — Encounter: Payer: Self-pay | Admitting: Surgical

## 2023-10-11 ENCOUNTER — Ambulatory Visit: Admitting: Surgical

## 2023-10-11 ENCOUNTER — Other Ambulatory Visit (HOSPITAL_COMMUNITY): Payer: Self-pay

## 2023-10-11 VITALS — BP 136/87 | HR 78 | Ht 72.0 in | Wt 227.4 lb

## 2023-10-11 DIAGNOSIS — D489 Neoplasm of uncertain behavior, unspecified: Secondary | ICD-10-CM

## 2023-10-11 DIAGNOSIS — L918 Other hypertrophic disorders of the skin: Secondary | ICD-10-CM

## 2023-10-11 MED ORDER — TRAMADOL HCL 50 MG PO TABS
50.0000 mg | ORAL_TABLET | Freq: Two times a day (BID) | ORAL | 0 refills | Status: AC | PRN
  Filled 2023-10-11: qty 4, 2d supply, fill #0

## 2023-10-11 MED ORDER — ONDANSETRON HCL 4 MG PO TABS
4.0000 mg | ORAL_TABLET | Freq: Three times a day (TID) | ORAL | 0 refills | Status: AC | PRN
  Filled 2023-10-11: qty 20, 7d supply, fill #0

## 2023-10-11 NOTE — Progress Notes (Signed)
 Patient ID: Jesse Jefferson, male    DOB: [birth year], 43 y.o.   MRN: [id number]  Chief Complaint  Patient presents with   Pre-op Exam      ICD-10-CM   1. Neoplasm, uncertain whether benign or malignant  D48.9     2. Skin tag  L91.8       History of Present Illness: Jesse Jefferson is a 43 y.o.  male  with a history of scalp mass, back mass and right lower eyelid skin tag.  He presents for preoperative evaluation for upcoming procedure, excision of scalp mass, back mass and right lower eyelid skin tag, scheduled for 10/26/2023 with Dr. Montorfano.  The patient has not had problems with anesthesia.  Patient had an endoscopy in the past without any issues.  Patient denies any cardiac or pulmonary disease.  He has no recent changes to his health.  He is not taking any blood thinners.  He does take some supplements such as fish oil which he is going to hold prior to surgery. No history of DVT/PE.  No family history of DVT/PE.  No family or personal history of bleeding or clotting disorders.  Patient is not currently taking any blood thinners.  No history of CVA/MI.   Summary of Previous Visit: Back mass with previous scar that is 2 cm, cyst is approximately 2.5 x 2.5 cm.  No overlying skin changes.  Head mass is 2 x 2 cm at the apex of the scalp. Right lower eyelid skin tag, it is very small, less than 1 mm.  Suspect mass of back and scalp are cysts  PMH Significant for: Previous excision of a back cyst.  Mass of scalp.  Patient has been feeling well lately with no recent changes to his health.  He was able to read to the consent form prior to appointment.  He does not have any specific questions related to the risks.     Past Medical History: Allergies: No Known Allergies  Current Medications:  Current Outpatient Medications:    ondansetron (ZOFRAN) 4 MG tablet, Take 1 tablet (4 mg total) by mouth every 8 (eight) hours as needed for nausea or vomiting., Disp: 20 tablet, Rfl: 0    traMADol (ULTRAM) 50 MG tablet, Take 1 tablet (50 mg total) by mouth every 12 (twelve) hours as needed for up to 2 days., Disp: 4 tablet, Rfl: 0   amoxicillin -clavulanate (AUGMENTIN ) 875-125 MG tablet, Take 1 tablet by mouth every 12 (twelve) hours., Disp: 20 tablet, Rfl: 0   ketorolac  (TORADOL ) 10 MG tablet, TAKE 1 TABLET EVERY 8 HOURS AS NEEDED FOR PAIN., Disp: 10 tablet, Rfl: 0   Lisdexamfetamine Dimesylate  (VYVANSE ) 60 MG CHEW, Chew 1 tablet by mouth daily in the morning, Disp: 30 tablet, Rfl: 0   traZODone  (DESYREL ) 50 MG tablet, Take 1-2 tablets (50-100 mg total) by mouth at bedtime as needed., Disp: 60 tablet, Rfl: 0   VYVANSE  60 MG CHEW, Chew by mouth. Take 30mg  by mouth bid., Disp: , Rfl:   Past Medical Problems: No past medical history on file.  Past Surgical History: No past surgical history on file.  Social History: Social History   Socioeconomic History   Marital status: Married    Spouse name: Not on file   Number of children: Not on file   Years of education: Not on file   Highest education level: Not on file  Occupational History   Not on file  Tobacco Use   Smoking status:  Never   Smokeless tobacco: Never  Substance and Sexual Activity   Alcohol use: Not on file   Drug use: Not on file   Sexual activity: Not on file  Other Topics Concern   Not on file  Social History Narrative   Not on file   Social Drivers of Health   Financial Resource Strain: Not on file  Food Insecurity: Not on file  Transportation Needs: Not on file  Physical Activity: Not on file  Stress: Not on file (11/21/2022)  Social Connections: Not on file  Intimate Partner Violence: Not on file    Family History: No family history on file.  Review of Systems: Review of Systems  Constitutional: Negative.   Respiratory: Negative.    Cardiovascular: Negative.   Neurological: Negative.     Physical Exam: Vital Signs BP 136/87   Pulse 78   Ht 6' (1.829 m)   Wt 227 lb 6 oz (103.1  kg)   SpO2 94%   BMI 30.84 kg/m   Physical Exam Constitutional:      General: Not in acute distress.    Appearance: Normal appearance. Not ill-appearing.  HENT:     Head: Normocephalic and atraumatic.  Eyes:     Pupils: Pupils are equal, round Neck:     Musculoskeletal: Normal range of motion.  Cardiovascular:     Rate and Rhythm: Normal rate    Pulses: Normal pulses.  Pulmonary:     Effort: Pulmonary effort is normal. No respiratory distress.  Abdominal:     General: Abdomen is flat. There is no distension.  Musculoskeletal: Normal range of motion.  Skin:    General: Skin is warm and dry.     Findings: No erythema or rash.  Neurological:     General: No focal deficit present.     Mental Status: Alert and oriented to person, place, and time. Mental status is at baseline.     Motor: No weakness.  Psychiatric:        Mood and Affect: Mood normal.        Behavior: Behavior normal.    Assessment/Plan: The patient is scheduled for excision of back mass, scalp mass and right lower eyelid skin tag with Dr. Eustacio Shape on [place].  Risks, benefits, and alternatives of procedure discussed, questions answered and consent obtained.    Smoking Status: Non-smoker; Counseling Given?  N/A  Caprini Score: 4, moderate; Risk Factors include: Age, BMI > 25, and length of planned surgery. Recommendation for mechanical  prophylaxis. Encourage early ambulation.   Pictures obtained: Pictures were obtained of the patient and placed in the chart with the patient's or guardian's permission.  Post-op Rx sent to pharmacy: Tramadol, Zofran  Patient was provided with the General Surgical Risk consent document and Pain Medication Agreement prior to their appointment.  They had adequate time to read through the risk consent documents and Pain Medication Agreement. We also discussed them in person together during this preop appointment. All of their questions were answered to their satisfaction.   Recommended calling if they have any further questions.  Risk consent form and Pain Medication Agreement to be scanned into patient's chart.  The risks that can be encountered with and after excision of a skin lesion were discussed and include the following but not limited to these: bleeding, infection, delayed healing, anesthesia risks, skin sensation changes, injury to structures including nerves, blood vessels, and muscles which may be temporary or permanent, allergies to tape, suture materials and glues, blood  products, topical preparations or injected agents, skin contour irregularities, skin discoloration and swelling, deep vein thrombosis, cardiac and pulmonary complications, pain, which may persist, persistent pain, recurrence of the lesion, poor healing of the incision, possible need for revisional surgery or staged procedures.   Electronically signed by: Donnice PARAS Ethelbert Thain, PA-C 10/11/2023 3:32 PM

## 2023-10-22 NOTE — Anesthesia Preprocedure Evaluation (Addendum)
 Anesthesia Evaluation  Patient identified by MRN, date of birth, ID band Patient awake    Reviewed: Allergy & Precautions, H&P , NPO status , Patient's Chart, lab work & pertinent test results  Airway Mallampati: II  TM Distance: >3 FB Neck ROM: Full    Dental  (+) Teeth Intact, Dental Advisory Given   Pulmonary neg pulmonary ROS   Pulmonary exam normal breath sounds clear to auscultation       Cardiovascular negative cardio ROS Normal cardiovascular exam Rhythm:Regular Rate:Normal     Neuro/Psych negative neurological ROS  negative psych ROS   GI/Hepatic negative GI ROS, Neg liver ROS,,,  Endo/Other  BMI 31  Renal/GU negative Renal ROS  negative genitourinary   Musculoskeletal negative musculoskeletal ROS (+)    Abdominal   Peds negative pediatric ROS (+)  Hematology negative hematology ROS (+)   Anesthesia Other Findings   Reproductive/Obstetrics negative OB ROS                              Anesthesia Physical Anesthesia Plan  ASA: 1  Anesthesia Plan: General   Post-op Pain Management: Tylenol PO (pre-op)*   Induction: Intravenous  PONV Risk Score and Plan: 2 and Ondansetron, Dexamethasone, Midazolam and Treatment may vary due to age or medical condition  Airway Management Planned: Oral ETT  Additional Equipment: None  Intra-op Plan:   Post-operative Plan: Extubation in OR  Informed Consent: I have reviewed the patients History and Physical, chart, labs and discussed the procedure including the risks, benefits and alternatives for the proposed anesthesia with the patient or authorized representative who has indicated his/her understanding and acceptance.     Dental advisory given  Plan Discussed with: CRNA  Anesthesia Plan Comments:          Anesthesia Quick Evaluation

## 2023-10-25 ENCOUNTER — Other Ambulatory Visit: Payer: Self-pay

## 2023-10-25 ENCOUNTER — Encounter (HOSPITAL_COMMUNITY): Payer: Self-pay

## 2023-10-25 NOTE — Progress Notes (Signed)
 SDW CALL  Patient was given pre-op instructions over the phone. The opportunity was given for the patient to ask questions. No further questions asked. Patient verbalized understanding of instructions given.   PCP - Jesse Jefferson Cardiologist - denies  PPM/ICD - denies   Chest x-ray - denies EKG - denies Stress Test - denies ECHO - denies Cardiac Cath - denies  Sleep Study - denies  No DM  Last dose of GLP1 agonist-  n/a GLP1 instructions:  n/a  Blood Thinner Instructions: n/a Aspirin Instructions: n/a  ERAS Protcol - clears until 1030 PRE-SURGERY Ensure or G2- n/a  COVID TEST- n/a   Anesthesia review: no  Patient denies shortness of breath, fever, cough and chest pain over the phone call   All instructions explained to the patient, with a verbal understanding of the material. Patient agrees to go over the instructions while at home for a better understanding.

## 2023-10-26 ENCOUNTER — Ambulatory Visit (HOSPITAL_COMMUNITY): Payer: Self-pay | Admitting: Anesthesiology

## 2023-10-26 ENCOUNTER — Ambulatory Visit (HOSPITAL_COMMUNITY): Admission: RE | Admit: 2023-10-26 | Discharge: 2023-10-26 | Disposition: A | Discharge status: Home / Self Care

## 2023-10-26 ENCOUNTER — Encounter (HOSPITAL_COMMUNITY): Payer: Self-pay

## 2023-10-26 ENCOUNTER — Encounter (HOSPITAL_COMMUNITY): Admission: RE | Disposition: A | Discharge status: Home / Self Care | Payer: Self-pay | Source: Home / Self Care

## 2023-10-26 DIAGNOSIS — R222 Localized swelling, mass and lump, trunk: Secondary | ICD-10-CM | POA: Diagnosis not present

## 2023-10-26 DIAGNOSIS — L720 Epidermal cyst: Secondary | ICD-10-CM

## 2023-10-26 DIAGNOSIS — L918 Other hypertrophic disorders of the skin: Secondary | ICD-10-CM | POA: Diagnosis not present

## 2023-10-26 DIAGNOSIS — D489 Neoplasm of uncertain behavior, unspecified: Secondary | ICD-10-CM | POA: Diagnosis present

## 2023-10-26 HISTORY — DX: Insomnia, unspecified: G47.00

## 2023-10-26 HISTORY — PX: EXCISION OF SKIN TAG: SHX6270

## 2023-10-26 HISTORY — PX: EXCISION MASS HEAD: SHX6702

## 2023-10-26 HISTORY — PX: EXCISION MASS, BACK: SHX7560

## 2023-10-26 SURGERY — EXCISION, SKIN TAG
Anesthesia: General | Site: Scalp | Laterality: Right

## 2023-10-26 MED ORDER — DEXMEDETOMIDINE HCL IN NACL 80 MCG/20ML IV SOLN
INTRAVENOUS | Status: DC | PRN
  Administered 2023-10-26: 8 ug via INTRAVENOUS
  Administered 2023-10-26: 4 ug via INTRAVENOUS

## 2023-10-26 MED ORDER — CEFAZOLIN SODIUM-DEXTROSE 2-3 GM-%(50ML) IV SOLR
INTRAVENOUS | Status: DC | PRN
  Administered 2023-10-26: 2 g via INTRAVENOUS

## 2023-10-26 MED ORDER — SUGAMMADEX SODIUM 200 MG/2ML IV SOLN
INTRAVENOUS | Status: DC | PRN
  Administered 2023-10-26: 200 mg via INTRAVENOUS

## 2023-10-26 MED ORDER — TOBRAMYCIN-DEXAMETHASONE 0.3-0.1 % OP SUSP: OPHTHALMIC | Status: DC | PRN

## 2023-10-26 MED ORDER — FENTANYL CITRATE (PF) 250 MCG/5ML IJ SOLN
INTRAMUSCULAR | Status: AC
  Filled 2023-10-26: qty 5

## 2023-10-26 MED ORDER — LIDOCAINE-EPINEPHRINE (PF) 1 %-1:200000 IJ SOLN
INTRAMUSCULAR | Status: DC | PRN
  Administered 2023-10-26: 9 mL

## 2023-10-26 MED ORDER — ROCURONIUM BROMIDE 10 MG/ML (PF) SYRINGE
PREFILLED_SYRINGE | INTRAVENOUS | Status: DC | PRN
  Administered 2023-10-26: 80 mg via INTRAVENOUS

## 2023-10-26 MED ORDER — ONDANSETRON HCL 4 MG/2ML IJ SOLN
INTRAMUSCULAR | Status: AC
Start: 2023-10-26 — End: 2023-10-26
  Filled 2023-10-26: qty 2

## 2023-10-26 MED ORDER — LIDOCAINE 2% (20 MG/ML) 5 ML SYRINGE
INTRAMUSCULAR | Status: DC | PRN
  Administered 2023-10-26: 80 mg via INTRAVENOUS

## 2023-10-26 MED ORDER — PROPOFOL 10 MG/ML IV BOLUS
INTRAVENOUS | Status: DC | PRN
  Administered 2023-10-26: 200 mg via INTRAVENOUS

## 2023-10-26 MED ORDER — TOBRAMYCIN-DEXAMETHASONE 0.3-0.1 % OP OINT
TOPICAL_OINTMENT | OPHTHALMIC | Status: DC | PRN
  Administered 2023-10-26: 1 via OPHTHALMIC

## 2023-10-26 MED ORDER — MIDAZOLAM HCL 2 MG/2ML IJ SOLN
INTRAMUSCULAR | Status: AC
Start: 2023-10-26 — End: 2023-10-26
  Filled 2023-10-26: qty 2

## 2023-10-26 MED ORDER — LIDOCAINE 2% (20 MG/ML) 5 ML SYRINGE
INTRAMUSCULAR | Status: AC
  Filled 2023-10-26: qty 5

## 2023-10-26 MED ORDER — ONDANSETRON HCL 4 MG/2ML IJ SOLN
INTRAMUSCULAR | Status: DC | PRN
  Administered 2023-10-26: 4 mg via INTRAVENOUS

## 2023-10-26 MED ORDER — BACITRACIN ZINC 500 UNIT/GM EX OINT
TOPICAL_OINTMENT | CUTANEOUS | Status: DC | PRN
  Administered 2023-10-26: 1 via TOPICAL

## 2023-10-26 MED ORDER — DEXAMETHASONE SOD PHOSPHATE PF 10 MG/ML IJ SOLN
INTRAMUSCULAR | Status: DC | PRN
  Administered 2023-10-26: 10 mg via INTRAVENOUS

## 2023-10-26 MED ORDER — CHLORHEXIDINE GLUCONATE 0.12 % MT SOLN
OROMUCOSAL | Status: AC
  Administered 2023-10-26: 15 mL via OROMUCOSAL
  Filled 2023-10-26: qty 15

## 2023-10-26 MED ORDER — PROPOFOL 10 MG/ML IV BOLUS
INTRAVENOUS | Status: AC
  Filled 2023-10-26: qty 20

## 2023-10-26 MED ORDER — LACTATED RINGERS IV SOLN: INTRAVENOUS | Status: DC

## 2023-10-26 MED ORDER — CHLORHEXIDINE GLUCONATE 0.12 % MT SOLN: 15.0000 mL | Freq: Once | OROMUCOSAL | Status: AC

## 2023-10-26 MED ORDER — BUPIVACAINE HCL 0.25 % IJ SOLN
INTRAMUSCULAR | Status: DC | PRN
  Administered 2023-10-26: 5 mL

## 2023-10-26 MED ORDER — POVIDONE-IODINE 10 % EX OINT
TOPICAL_OINTMENT | CUTANEOUS | Status: AC
  Filled 2023-10-26: qty 28.35

## 2023-10-26 MED ORDER — ROCURONIUM BROMIDE 10 MG/ML (PF) SYRINGE
PREFILLED_SYRINGE | INTRAVENOUS | Status: AC
Start: 2023-10-26 — End: 2023-10-26
  Filled 2023-10-26: qty 10

## 2023-10-26 MED ORDER — PHENYLEPHRINE 80 MCG/ML (10ML) SYRINGE FOR IV PUSH (FOR BLOOD PRESSURE SUPPORT)
PREFILLED_SYRINGE | INTRAVENOUS | Status: DC | PRN
  Administered 2023-10-26: 80 ug via INTRAVENOUS

## 2023-10-26 MED ORDER — ORAL CARE MOUTH RINSE
15.0000 mL | Freq: Once | OROMUCOSAL | Status: AC
Start: 2023-10-26 — End: 2023-10-26

## 2023-10-26 MED ORDER — LIDOCAINE-EPINEPHRINE (PF) 1 %-1:200000 IJ SOLN
INTRAMUSCULAR | Status: AC
  Filled 2023-10-26: qty 30

## 2023-10-26 MED ORDER — ACETAMINOPHEN 500 MG PO TABS
1000.0000 mg | ORAL_TABLET | Freq: Once | ORAL | Status: AC
  Administered 2023-10-26: 1000 mg via ORAL
  Filled 2023-10-26: qty 2

## 2023-10-26 MED ORDER — FENTANYL CITRATE (PF) 250 MCG/5ML IJ SOLN
INTRAMUSCULAR | Status: DC | PRN
  Administered 2023-10-26 (×2): 50 ug via INTRAVENOUS

## 2023-10-26 MED ORDER — MIDAZOLAM HCL 2 MG/2ML IJ SOLN
INTRAMUSCULAR | Status: DC | PRN
  Administered 2023-10-26: 2 mg via INTRAVENOUS

## 2023-10-26 MED ORDER — 0.9 % SODIUM CHLORIDE (POUR BTL) OPTIME
TOPICAL | Status: DC | PRN
  Administered 2023-10-26: 1000 mL

## 2023-10-26 SURGICAL SUPPLY — 37 items
APPLICATOR DR MATTHEWS STRL (MISCELLANEOUS) IMPLANT
BLADE CLIPPER SURG (BLADE) IMPLANT
BLADE SURG 15 STRL LF DISP TIS (BLADE) ×3 IMPLANT
CNTNR URN SCR LID CUP LEK RST (MISCELLANEOUS) IMPLANT
DRAPE LAPAROTOMY 100X72 PEDS (DRAPES) IMPLANT
DRESSING MEPILEX FLEX 4X4 (GAUZE/BANDAGES/DRESSINGS) IMPLANT
ELECT COATED BLADE 2.86 ST (ELECTRODE) IMPLANT
ELECTRODE REM PT RTRN 9FT ADLT (ELECTROSURGICAL) ×3 IMPLANT
GAUZE SPONGE 4X4 12PLY STRL LF (GAUZE/BANDAGES/DRESSINGS) ×3 IMPLANT
GLOVE BIO SURGEON STRL SZ7.5 (GLOVE) ×3 IMPLANT
GLOVE BIOGEL PI IND STRL 8 (GLOVE) ×3 IMPLANT
GLOVE SURG SS PI 7.5 STRL IVOR (GLOVE) IMPLANT
GOWN STRL REUS W/ TWL LRG LVL3 (GOWN DISPOSABLE) ×9 IMPLANT
GOWN STRL SURGICAL XL XLNG (GOWN DISPOSABLE) IMPLANT
KIT BASIN OR (CUSTOM PROCEDURE TRAY) ×3 IMPLANT
MANIFOLD NEPTUNE II (INSTRUMENTS) IMPLANT
NDL HYPO 22X1.5 SAFETY MO (MISCELLANEOUS) IMPLANT
NDL HYPO 25GX1X1/2 BEV (NEEDLE) ×3 IMPLANT
NEEDLE HYPO 22X1.5 SAFETY MO (MISCELLANEOUS) ×3 IMPLANT
NEEDLE HYPO 25GX1X1/2 BEV (NEEDLE) ×3 IMPLANT
PACK SRG BSC III STRL LF ECLPS (CUSTOM PROCEDURE TRAY) ×3 IMPLANT
PENCIL BUTTON HOLSTER BLD 10FT (ELECTRODE) ×3 IMPLANT
SOLN 0.9% NACL 1000 ML (IV SOLUTION) ×3 IMPLANT
SOLN 0.9% NACL POUR BTL 1000ML (IV SOLUTION) ×3 IMPLANT
SPIKE FLUID TRANSFER (MISCELLANEOUS) ×3 IMPLANT
SPONGE T-LAP 18X18 ~~LOC~~+RFID (SPONGE) ×6 IMPLANT
STAPLER SKIN PROX 35W (STAPLE) IMPLANT
SUT ETHILON 3 0 PS 1 (SUTURE) IMPLANT
SUT MNCRL AB 3-0 PS2 27 (SUTURE) IMPLANT
SUT SILK 2 0 SH (SUTURE) ×3 IMPLANT
SUT VIC AB 2-0 SH 27XBRD (SUTURE) IMPLANT
SYR 10ML LL (SYRINGE) IMPLANT
SYR BULB EAR ULCER 3OZ GRN STR (SYRINGE) ×3 IMPLANT
SYR CONTROL 10ML LL (SYRINGE) ×3 IMPLANT
TOWEL GREEN STERILE FF (TOWEL DISPOSABLE) ×3 IMPLANT
TUBE CONNECTING 12X1/4 (SUCTIONS) ×3 IMPLANT
YANKAUER SUCT BULB TIP NO VENT (SUCTIONS) ×3 IMPLANT

## 2023-10-26 NOTE — Transfer of Care (Signed)
 Immediate Anesthesia Transfer of Care Note  Patient: Jesse Jefferson  Procedure(s) Performed: EXCISION, SKIN TAG (Right: Eye) EXCISION, MASS, HEAD (Scalp) EXCISION MASS, BACK (Back)  Patient Location: PACU  Anesthesia Type:General  Level of Consciousness: awake and alert   Airway & Oxygen Therapy: Patient Spontanous Breathing and Patient connected to nasal cannula oxygen  Post-op Assessment: Report given to RN and Post -op Vital signs reviewed and stable  Post vital signs: Reviewed and stable  Last Vitals:  Vitals Value Taken Time  BP 145/83 10/26/23 15:05  Temp 36.7 C 10/26/23 15:05  Pulse 74 10/26/23 15:08  Resp 20 10/26/23 15:08  SpO2 99 % 10/26/23 15:08  Vitals shown include unfiled device data.  Last Pain:  Vitals:   10/26/23 1212  TempSrc:   PainSc: 0-No pain         Complications: No notable events documented.

## 2023-10-26 NOTE — Anesthesia Postprocedure Evaluation (Signed)
 Anesthesia Post Note  Patient: Jesse Jefferson  Procedure(s) Performed: EXCISION, SKIN TAG (Right: Eye) EXCISION, MASS, HEAD (Scalp) EXCISION MASS, BACK (Back)     Patient location during evaluation: PACU Anesthesia Type: General Level of consciousness: awake and alert, oriented and patient cooperative Pain management: pain level controlled Vital Signs Assessment: post-procedure vital signs reviewed and stable Respiratory status: spontaneous breathing, nonlabored ventilation and respiratory function stable Cardiovascular status: blood pressure returned to baseline and stable Postop Assessment: no apparent nausea or vomiting Anesthetic complications: no   No notable events documented.  Last Vitals:  Vitals:   10/26/23 1524 10/26/23 1530  BP: (!) 143/99 (!) 136/99  Pulse: 67 69  Resp: 20 20  Temp:  36.7 C  SpO2: 98% 94%    Last Pain:  Vitals:   10/26/23 1530  TempSrc:   PainSc: 0-No pain                 Almarie CHRISTELLA Marchi

## 2023-10-26 NOTE — Progress Notes (Signed)
 No pre-op labs needed per Dr. Merla.   Joesph LOISE Stake, RN

## 2023-10-26 NOTE — H&P (Signed)
 No diagnosis found.   History of Present Illness: Jesse Jefferson is a 43 y.o.  male  with a history of back and scalp cysts as well as skin tag of the lower eyelid.  He presents for preoperative evaluation for upcoming procedure, excision of back and scalp cysts as well as skin tag of the lower eyelid, scheduled for 10/26/2023 with Dr. Aja Whitehair.  Past Medical History: Allergies: Allergies  Allergen Reactions   Other Itching    Almonds, avacado, peaches, plums, pears, walnuts    Pt endorses throat itching, tightness    Current Medications:  Current Facility-Administered Medications:    lactated ringers infusion, , Intravenous, Continuous, Merla Almarie HERO, DO, Last Rate: 10 mL/hr at 10/26/23 1220, New Bag at 10/26/23 1220  Past Medical Problems: Past Medical History:  Diagnosis Date   Insomnia     Past Surgical History: Past Surgical History:  Procedure Laterality Date   bone graph     upper left jaw   UPPER GI ENDOSCOPY     VASECTOMY  01/2022    The patient has had anesthesia or sedation in the past.   The patient has not had problems with anesthesia.  The patient does not have a family history of anesthesia problems.    Social History: Social History   Socioeconomic History   Marital status: Married    Spouse name: Not on file   Number of children: Not on file   Years of education: Not on file   Highest education level: Not on file  Occupational History   Not on file  Tobacco Use   Smoking status: Never   Smokeless tobacco: Never  Substance and Sexual Activity   Alcohol use: Yes    Comment: 2 drinks a week   Drug use: Not Currently   Sexual activity: Not on file  Other Topics Concern   Not on file  Social History Narrative   Not on file   Social Drivers of Health   Financial Resource Strain: Not on file  Food Insecurity: Not on file  Transportation Needs: Not on file  Physical Activity: Not on file  Stress: Not on file (11/21/2022)  Social  Connections: Not on file  Intimate Partner Violence: Not on file    Family History: History reviewed. No pertinent family history.  Review of Systems: ROS negative except as noted in HPI   Physical Exam: Vital Signs BP (!) 149/92   Pulse 73   Temp 97.7 F (36.5 C) (Oral)   Resp 18   Ht 6' (1.829 m)   Wt 99.8 kg   SpO2 100%   BMI 29.84 kg/m  General:alert and cooperative Skin: back and scalp cysts as well as skin tag of the lower eyelid Assessment: 43 y.o.male  with a history of back and scalp cysts as well as skin tag of the lower eyelid  Plan: OR for excision of back and scalp cysts as well as skin tag of the lower eyelid with Dr. Aidan Moten.  Risks, benefits, and alternatives of procedure discussed, questions answered and consent obtained.     Electronically signed by: Jadon Ressler M Iveliz Garay, MD 10/26/2023 1:05 PM

## 2023-10-26 NOTE — Discharge Instructions (Addendum)
 Activity (include date of return to work if known) As tolerated: NO showers for 48 hours NO driving No heavy activities for 6 weeks  Diet:regular No restrictions:  Wound Care: Keep dressing clean & dry. Remove dressings in 48 hours  Special Instructions: Do not raise arms up Apply Tobramycin oinment BID for 3 days. Apply Bacitracin to scalp and back for 3 days after removing the dressings.  Call Doctor if any unusual problems occur such as pain, excessive Bleeding, unrelieved Nausea/vomiting, Fever &/or chills When lying down, keep head elevated on 2-3 pillows or back-rest Follow-up appointment: Please call the office.  The patient received discharge instruction from:___________________________________________   Patient signature ________________________________________ / Date___________    Signature of individual providing instructions/ Date________________

## 2023-10-26 NOTE — Op Note (Addendum)
 Operative Note   DATE OF OPERATION: 10/26/2023  LOCATION: [facility]  SURGICAL DEPARTMENT: Plastic Surgery  PREOPERATIVE DIAGNOSES:  Back and scalp masses, skin tag of the lower eyelid.   POSTOPERATIVE DIAGNOSES:  same  PROCEDURE:   Excision of back mass 3 x 2 cm 2.   Excision of scalp mass 2 cm x 1 cm 3.   Right lower eyelid skin tag excision  SURGEON: Jairus Tonne, MD  ASSISTANT: Estefana Peck, PA  ANESTHESIA:  General.   COMPLICATIONS: None.   EBL < 50 cc  INDICATIONS FOR PROCEDURE:   The patient, Jesse Jefferson is a 43 y.o. male born on 1980-03-18, is here for treatment of  back and scalp cysts as well as skin tag of the lower eyelid.  MRN: [id number]  CONSENT:  Informed consent was obtained directly from the patient. Risks, benefits and alternatives were fully discussed. Specific risks including but not limited to bleeding, infection, hematoma, seroma, scarring, pain, infection, contracture, asymmetry, wound healing problems, and need for further surgery were all discussed. The patient did have an ample opportunity to have questions answered to satisfaction.   DESCRIPTION OF PROCEDURE:  The patient was taken to the operating room. SCDs were placed and IV antibiotics were given. The patient's operative site was prepped and draped in a sterile fashion. A time out was performed and all information was confirmed to be correct.  General anesthesia was administered.    We first focus on the scalp mass, local anesthesia (a mix of lidocaine with epinephrine and quarter percent Marcaine) was injected. The incision was then made on top of the scalp mass. The mass was circumferentially dissected down to galea aponeurotica but not invading it, and it should be noted that it had cystic appearance. The size of the mass was 2 cm x 1 cm. The mass was removed and sent as a pathological specimen. The wound was irrigated with normal saline and hemostasis was achieved with  Bovie electrocautery. The residual 3 centimeter wound was closed in layers using 3-0 Monocryl and 3-0 Nylon. The incision was dressed with bacitracin and OpSite dressing.    We then focused our attention on the back mass, local anesthesia (a mix of lidocaine with epinephrine and quarter percent Marcaine) was injected. The incision was then made on top of the back mass. There was a lot of scar tissue around the mass and the dissection was carried sharply with a 15 blade circumferentially down to paraspinal muscle fascia but not invading it, It should be noted that it had cystic appearance and was friable. The size of the mass was 3 cm x 2 cm. The mass was removed and sent as a pathological specimen.  The wound was irrigated with normal saline and hemostasis was achieved with Bovie electrocautery. The residual 4 cm wound was closed in layers using 2-0 Vicryl, 3-0 Monocryl and 3-0 Nylon. The incision was dressed with bacitracin and OpSite dressing.   The attention wound was then focused on the right lower eyelid.  The skin that was identified and removed using sharp scissors.  Hemostasis was achieved. The wound was dressed with tobramycin ointment.  The patient tolerated the procedure well.  There were no complications. The patient was allowed to wake from anesthesia, extubated and taken to the recovery room in satisfactory condition.   The advanced practice practitioner (APP) assisted throughout the case.  The APP was essential in retraction and counter traction when needed to make the case progress smoothly.  This retraction and assistance made it possible to see the tissue plans for the procedure.  The assistance was needed for blood control, tissue re-approximation and assisted with closure of the incision site.   Carmeron Heady M. Mohamedamin Nifong, MD [facility]

## 2023-10-26 NOTE — Anesthesia Procedure Notes (Signed)
 Procedure Name: Intubation Date/Time: 10/26/2023 2:39 PM  Performed by: Julien Manus, CRNAPre-anesthesia Checklist: Patient identified, Emergency Drugs available, Suction available and Patient being monitored Patient Re-evaluated:Patient Re-evaluated prior to induction Oxygen Delivery Method: Circle System Utilized Preoxygenation: Pre-oxygenation with 100% oxygen Induction Type: IV induction Ventilation: Mask ventilation without difficulty Laryngoscope Size: Mac and 4 Grade View: Grade I Tube type: Oral Tube size: 7.5 mm Number of attempts: 1 Airway Equipment and Method: Stylet and Oral airway Placement Confirmation: ETT inserted through vocal cords under direct vision, positive ETCO2 and breath sounds checked- equal and bilateral Secured at: 23 cm Tube secured with: Tape Dental Injury: Teeth and Oropharynx as per pre-operative assessment

## 2023-10-27 ENCOUNTER — Encounter (HOSPITAL_COMMUNITY): Payer: Self-pay

## 2023-11-01 LAB — SURGICAL PATHOLOGY

## 2023-11-02 ENCOUNTER — Other Ambulatory Visit (HOSPITAL_COMMUNITY): Payer: Self-pay

## 2023-11-02 ENCOUNTER — Ambulatory Visit

## 2023-11-02 VITALS — BP 136/82 | HR 61 | Temp 97.7°F | Ht 72.0 in | Wt 229.0 lb

## 2023-11-02 DIAGNOSIS — Z9889 Other specified postprocedural states: Secondary | ICD-10-CM

## 2023-11-02 DIAGNOSIS — Z09 Encounter for follow-up examination after completed treatment for conditions other than malignant neoplasm: Secondary | ICD-10-CM

## 2023-11-02 MED ORDER — DOXYCYCLINE HYCLATE 100 MG PO TABS
100.0000 mg | ORAL_TABLET | Freq: Two times a day (BID) | ORAL | 0 refills | Status: AC
  Filled 2023-11-02: qty 14, 7d supply, fill #0

## 2023-11-02 NOTE — Progress Notes (Signed)
 Postoperative follow-up  Patient is a 43 year old status post back mass excision, scalp mass excision and right eyelid skin tag excision. Patient is doing well after surgery, complains of no pain.  We reviewed the pathology together, the back mass is an epidermal inclusion cyst the scalp mass is an epidermal inclusion cyst and the skin tag is a fibroepithelial polyp. Incisions in the scalp clean dry intact, in the right eyelid clean dry intact, mild erythema at back incision with a small amount of fluid below the incision.  This was somewhat expected due to the fact that the mass was surrounded by scar tissue and went down to paraspinous muscle, which was identified and protected during surgery.  I suggested to the patient draining of the small seroma. After obtaining verbal consent from the patient and under sterile conditions, local anesthesia using lidocaine 1% with epinephrine was injected and then the seroma was aspirated.  2 cc of fluid were obtained.  It did not look infected.  The incision was covered with sterile Mepilex on the back. Instructed patient to keep the incisions clean dry intact and covered from the sun. The decision was made to keep the stitches until next week.  I will start the patient on doxycycline twice daily prophylaxis given the mild erythema at the incision site and seroma drainage.  Patient will follow-up with me or PA for stitch removal next week and will follow-up with me in 3 months.  Jenyfer Trawick M. Leslie Langille, MD [facility]

## 2023-11-03 ENCOUNTER — Other Ambulatory Visit (HOSPITAL_COMMUNITY): Payer: Self-pay

## 2023-11-03 MED ORDER — LISDEXAMFETAMINE DIMESYLATE 50 MG PO CHEW
50.0000 mg | CHEWABLE_TABLET | Freq: Every morning | ORAL | 0 refills | Status: DC
  Filled 2023-11-03: qty 14, 14d supply, fill #0

## 2023-11-08 ENCOUNTER — Ambulatory Visit (INDEPENDENT_AMBULATORY_CARE_PROVIDER_SITE_OTHER): Admitting: Student

## 2023-11-08 VITALS — BP 144/93 | HR 73

## 2023-11-08 DIAGNOSIS — Z09 Encounter for follow-up examination after completed treatment for conditions other than malignant neoplasm: Secondary | ICD-10-CM

## 2023-11-08 NOTE — Progress Notes (Signed)
 Patient is a 43 year old male who recently underwent excision of back and scalp masses and skin tag of the lower eyelid with Dr. Montorfano on 10/26/2023.  He is about 2 weeks postop.  He presents to the clinic today for postoperative follow-up.  Patient was last seen in the clinic by Dr. Montorfano on 11/02/2023.  At this visit, patient was doing well after surgery.  Pathology was reviewed together the back and scalp masses were epidermal inclusion cysts, the skin tag was fibroepithelial polyp.  Incisions to the scalp were clean dry and intact, the right eyelid was clean dry and intact, there is mild erythema at the back incision with a small amount of fluid beneath the incision.  2 cc of fluid were aspirated from the back surgical site.  Plan was for patient to keep incision clean dry and intact and covered from the sun.  Decision was made to keep the stitches in until the following week.  Patient was placed on doxycycline twice daily for prophylaxis for the mild erythema at the incision site and the seroma drainage.  Today, patient reports he is doing well.  He states that his back incision is a little bit itchy from time to time, otherwise denies any new complaints or concerns.  He denies any fevers or chills.  He denies any recent drainage from any of the procedure sites.  He reports that he has about 1 day left of the antibiotics.  On exam, patient is sitting upright in no acute distress.  Procedure site to the right lower eyelid appears to be completely healed.  To the scalp, incision is intact with nylon sutures.  There is no surrounding erythema or drainage.  To the back, incision is overall intact with nylon sutures.  There is some  superficial scabbing noted to the incision.  There is a little bit of firmness underneath the incision, it does feel like scar tissue.  It does not appear to be fluctuant.  There is no surrounding erythema.  No active drainage on exam.  No signs of infection.  Discussed  with the patient that I would like him to apply Vaseline to his back and scalp incisions daily.  Discussed with him that I would like him to gently massage the incision on the back.  Recommended he continue to closely monitor the incision sites and if he has any worsening swelling, pain, redness, drainage, fevers, chills to let us  know.  Patient expressed understanding.  Discussed with the patient that we can tentatively set an appointment up for about 2 weeks for another check.  I discussed with him that if he feels that things are completely resolved and has no concerns at that time, he may cancel that appointment and come back in 3 months to see Dr. Montorfano.  He expressed understanding and was in agreement with this plan.  I instructed him to call if he has any questions or concerns about anything.

## 2023-11-20 ENCOUNTER — Other Ambulatory Visit (HOSPITAL_COMMUNITY): Payer: Self-pay

## 2023-11-20 MED ORDER — LISDEXAMFETAMINE DIMESYLATE 50 MG PO CHEW
50.0000 mg | CHEWABLE_TABLET | Freq: Every morning | ORAL | 0 refills | Status: DC
  Filled 2023-11-20: qty 30, 30d supply, fill #0

## 2023-11-24 ENCOUNTER — Encounter: Admitting: Student

## 2023-11-26 NOTE — Progress Notes (Signed)
 Patient is a 43 year old male who recently underwent excision of back and scalp masses and skin tag of the lower eyelid with Dr. Montorfano on 10/26/2023 who presents to clinic for postoperative follow-up.  He was last seen here in clinic on 11/08/2023.  At that time, the excision sites were a bit itchy, but otherwise he was doing well from a postoperative standpoint.  It was advised that he apply Vaseline to the excision sites and gently massage the areas.  Per last note, he needs to schedule an appointment with Dr. Montorfano for a 65-month follow-up encounter.  Today, patient is doing excellent from a postprocedural standpoint.  He feels as though there is perhaps something protruding from the upper portion of his back excision site.  There is a similar bump on the scalp excision site.  Otherwise, no specific concerns or complaints.  On exam, there does appear to be a buried suture protruding through the superior portion of back excision site.  This is removed without complication, well-tolerated by patient.  No surrounding erythema.  The remainder of excision site appears to be well-healed.  As for the scalp, there was an absorbable suture knot noted that was also carefully removed.  Overall he appears to have nicely healed.  Recommending Vaseline to help with the superficial wounds, but then no ongoing wound care or restrictions beginning next week.  Follow-up in 2 to 3 months with Dr. Montorfano.  He can call the office should he have any questions or concerns in interim.  Picture(s) obtained of the patient and placed in the chart were with the patient's or guardian's permission.

## 2023-11-29 ENCOUNTER — Ambulatory Visit (INDEPENDENT_AMBULATORY_CARE_PROVIDER_SITE_OTHER): Admitting: Physician Assistant

## 2023-11-29 VITALS — BP 134/80 | HR 76

## 2023-11-29 DIAGNOSIS — Z9889 Other specified postprocedural states: Secondary | ICD-10-CM

## 2023-11-29 DIAGNOSIS — L720 Epidermal cyst: Secondary | ICD-10-CM

## 2023-12-27 ENCOUNTER — Other Ambulatory Visit (HOSPITAL_COMMUNITY): Payer: Self-pay

## 2023-12-27 MED ORDER — LISDEXAMFETAMINE DIMESYLATE 50 MG PO CHEW
1.0000 | CHEWABLE_TABLET | Freq: Every morning | ORAL | 0 refills | Status: DC
  Filled 2023-12-27: qty 30, 30d supply, fill #0

## 2024-01-28 ENCOUNTER — Other Ambulatory Visit: Payer: Self-pay

## 2024-01-28 ENCOUNTER — Other Ambulatory Visit (HOSPITAL_COMMUNITY): Payer: Self-pay

## 2024-01-28 MED ORDER — LISDEXAMFETAMINE DIMESYLATE 50 MG PO CHEW
50.0000 mg | CHEWABLE_TABLET | Freq: Every morning | ORAL | 0 refills | Status: AC
  Filled 2024-01-28: qty 30, 30d supply, fill #0

## 2024-02-06 ENCOUNTER — Other Ambulatory Visit (HOSPITAL_COMMUNITY): Payer: Self-pay

## 2024-03-01 ENCOUNTER — Ambulatory Visit
